# Patient Record
Sex: Male | Born: 1975 | Race: White | Hispanic: No | Marital: Single | Smoking: Never smoker
Health system: Southern US, Community
[De-identification: ages and names within clinical notes are randomized; demographics above are authoritative.]

---

## 2012-06-03 ENCOUNTER — Encounter (HOSPITAL_COMMUNITY): Payer: Self-pay | Admitting: Emergency Medicine

## 2012-06-03 ENCOUNTER — Emergency Department (HOSPITAL_COMMUNITY)
Admission: EM | Admit: 2012-06-03 | Discharge: 2012-06-03 | Disposition: A | Discharge status: Home / Self Care | Payer: Self-pay | Source: Home / Self Care | Attending: Emergency Medicine | Admitting: Emergency Medicine

## 2012-06-03 DIAGNOSIS — K5289 Other specified noninfective gastroenteritis and colitis: Secondary | ICD-10-CM

## 2012-06-03 DIAGNOSIS — K529 Noninfective gastroenteritis and colitis, unspecified: Secondary | ICD-10-CM

## 2012-06-03 DIAGNOSIS — R109 Unspecified abdominal pain: Secondary | ICD-10-CM

## 2012-06-03 MED ORDER — ACIDOPHILUS PROBIOTIC BLEND PO CAPS: 2.0000 | ORAL_CAPSULE | Freq: Two times a day (BID) | ORAL | Status: DC

## 2012-06-03 MED ORDER — DIPHENOXYLATE-ATROPINE 2.5-0.025 MG PO TABS: 1.0000 | ORAL_TABLET | Freq: Four times a day (QID) | ORAL | Status: AC | PRN

## 2012-06-03 NOTE — ED Notes (Signed)
C/o abdominal pain.  Onset 7/10.  Initially had bloating, then cramps and diarrhea.  Diarrhea has decreased .  Reports 2-3 diarrhea stools today.  Patient reports intermittent, similar episodes since thanksgiving.  Patient relates symptoms to eating, admits to eating frequently at fast food restaurants

## 2012-06-03 NOTE — ED Provider Notes (Signed)
History     CSN: [id number]  Arrival date & time 06/03/12  1437   First MD Initiated Contact with Patient 06/03/12 1437      Chief Complaint  Patient presents with  . Abdominal Pain    (Consider location/radiation/quality/duration/timing/severity/associated sxs/prior treatment) HPI Comments: Patient states he ate at [employer] several days ago, reports acute onset of midline abdominal cramping, which is followed by multiple episodes of watery, nonbloody stools. Abdominal cramping improved after having bowel movement. States that the diarrhea has decreased to 2-3 episodes today, and that his stool is now becoming firmer. States that he has some abdominal "soreness", but that the cramps are improving. Reports anorexia, but has been able to eat some small, bland meals.  this does not seem to exacerbate the cramps or the diarrhea. No abdominal distention, nausea, vomiting, fevers. No urinary urgency, frequency, cloudy or oderous urine, hematuria. No decreased urine output. No penile discharge, testicular pain, testicular swelling. Denies alcohol use. No recent travel, recent antibiotic use, raw or undercooked foods, questionable leftovers, known sick contacts. He does not take any medicines on a regular basis. He is not a diabetic.   ROS as noted in HPI. All other ROS negative.   Patient is a 36 y.o. male presenting with abdominal pain. The history is provided by the patient. No language interpreter was used.  Abdominal Pain The primary symptoms of the illness include abdominal pain. The current episode started more than 2 days ago. The onset of the illness was sudden. The problem has been gradually improving.  Additional symptoms associated with the illness include anorexia. Symptoms associated with the illness do not include chills, diaphoresis, heartburn, constipation, urgency, hematuria or frequency. Significant associated medical issues do not include diabetes, gallstones or diverticulitis.     History reviewed. No pertinent past medical history.  History reviewed. No pertinent past surgical history.  History reviewed. No pertinent family history.  History  Substance Use Topics  . Smoking status: Never Smoker   . Smokeless tobacco: Not on file  . Alcohol Use: No      Review of Systems  Constitutional: Negative for chills and diaphoresis.  Gastrointestinal: Positive for abdominal pain and anorexia. Negative for heartburn and constipation.  Genitourinary: Negative for urgency, frequency and hematuria.    Allergies  Penicillins  Home Medications   Current Outpatient Rx  Name Route Sig Dispense Refill  . DIPHENOXYLATE-ATROPINE 2.5-0.025 MG PO TABS Oral Take 1 tablet by mouth 4 (four) times daily as needed for diarrhea or loose stools. 30 tablet 0  . ACIDOPHILUS PROBIOTIC BLEND PO CAPS Oral Take 2 capsules by mouth 2 (two) times daily. 60 capsule 0    BP 120/82  Pulse 75  Temp 98.3 F (36.8 C) (Oral)  Resp 17  SpO2 97%   Physical Exam  Nursing note and vitals reviewed. Constitutional: He is oriented to person, place, and time. He appears well-developed and well-nourished.  HENT:  Head: Normocephalic and atraumatic.  Eyes: Conjunctivae and EOM are normal.  Neck: Normal range of motion.  Cardiovascular: Normal rate, regular rhythm and normal heart sounds.   Pulmonary/Chest: Effort normal. No respiratory distress.  Abdominal: Soft. Normal appearance and bowel sounds are normal. He exhibits no distension. There is tenderness in the right lower quadrant. There is guarding. There is no rigidity, no CVA tenderness, no tenderness at McBurney's point and negative Murphy's sign. No hernia.         Mild tenderness to deep palpation, see drawing.  Genitourinary: Rectum normal  and prostate normal. Rectal exam shows no external hemorrhoid, no internal hemorrhoid, no mass, no tenderness and anal tone normal. Guaiac negative stool.  Musculoskeletal: Normal range of  motion.  Neurological: He is alert and oriented to person, place, and time. Coordination normal.  Skin: Skin is warm and dry.  Psychiatric: He has a normal mood and affect. His behavior is normal. Judgment and thought content normal.    ED Course  Procedures (including critical care time)  Labs Reviewed - No data to display No results found.   1. Abdominal pain   2. Gastroenteritis    Hemoccult was done, was negative.  MDM  No previous records available   patient's vitals are normal, afebrile. States his pain and diarrhea are improving. Pt abd exam is benign, no peritoneal signs. No evidence of surgical abd. Doubt SBO, mesenteric ischemia, appendicitis, hepatitis, cholecystitis, pancreatitis, or perforated viscus. No evidence to suggest testicular source for abdominal pain. Doubt UTI or GU cause of his abdominal pain.  History most suggestive of food poisoning, which is now getting better. Has some mild right lower and tenderness, but no peritoneal signs.  Will have him return tomorrow for repeat abdominal exam. Will try some Lomotil for any persistent abdominal cramping, probiotics. Had extensive discussion on signs and symptoms that should prompt him to go to the emergency department. He agrees to go to the ED for any of these.  Luiz Blare, MD 06/03/12 2113

## 2016-06-18 ENCOUNTER — Emergency Department (HOSPITAL_COMMUNITY)
Admission: EM | Admit: 2016-06-18 | Discharge: 2016-06-19 | Disposition: A | Discharge status: Home / Self Care | Payer: Self-pay | Attending: Emergency Medicine | Admitting: Emergency Medicine

## 2016-06-18 DIAGNOSIS — IMO0001 Reserved for inherently not codable concepts without codable children: Secondary | ICD-10-CM

## 2016-06-18 DIAGNOSIS — R42 Dizziness and giddiness: Secondary | ICD-10-CM | POA: Insufficient documentation

## 2016-06-18 DIAGNOSIS — Z79891 Long term (current) use of opiate analgesic: Secondary | ICD-10-CM | POA: Insufficient documentation

## 2016-06-18 DIAGNOSIS — I10 Essential (primary) hypertension: Secondary | ICD-10-CM | POA: Insufficient documentation

## 2016-06-18 DIAGNOSIS — R110 Nausea: Secondary | ICD-10-CM | POA: Insufficient documentation

## 2016-06-18 DIAGNOSIS — R030 Elevated blood-pressure reading, without diagnosis of hypertension: Secondary | ICD-10-CM

## 2016-06-18 NOTE — ED Triage Notes (Signed)
Patient advised that has been feeling "spacey" x2 weeks.  Patient states that feels "warm and clammy" off and on. Denies fevers at home.  Patient went to Urgent Care today where they told him that his blood pressure is elevated and was advised to come to the ED.  Patient denies chest pain, denies SOB. Denies any pain at this time.

## 2016-06-19 ENCOUNTER — Emergency Department (HOSPITAL_COMMUNITY): Payer: Self-pay

## 2016-06-19 ENCOUNTER — Encounter (HOSPITAL_COMMUNITY): Payer: Self-pay

## 2016-06-19 LAB — CBC WITH DIFFERENTIAL/PLATELET
BASOS PCT: 0 %
Basophils Absolute: 0 10*3/uL (ref 0.0–0.1)
Eosinophils Absolute: 0 10*3/uL (ref 0.0–0.7)
Eosinophils Relative: 0 %
HEMATOCRIT: 45 % (ref 39.0–52.0)
Hemoglobin: 16.2 g/dL (ref 13.0–17.0)
LYMPHS PCT: 9 %
Lymphs Abs: 1.1 10*3/uL (ref 0.7–4.0)
MCH: 33.3 pg (ref 26.0–34.0)
MCHC: 36 g/dL (ref 30.0–36.0)
MCV: 92.4 fL (ref 78.0–100.0)
MONOS PCT: 4 %
Monocytes Absolute: 0.5 10*3/uL (ref 0.1–1.0)
NEUTROS ABS: 10.7 10*3/uL — AB (ref 1.7–7.7)
NEUTROS PCT: 87 %
Platelets: 270 10*3/uL (ref 150–400)
RBC: 4.87 MIL/uL (ref 4.22–5.81)
RDW: 12.1 % (ref 11.5–15.5)
WBC: 12.3 10*3/uL — ABNORMAL HIGH (ref 4.0–10.5)

## 2016-06-19 LAB — I-STAT CHEM 8, ED
BUN: 11 mg/dL (ref 6–20)
CHLORIDE: 103 mmol/L (ref 101–111)
Calcium, Ion: 1.22 mmol/L (ref 1.13–1.30)
Creatinine, Ser: 0.9 mg/dL (ref 0.61–1.24)
Glucose, Bld: 120 mg/dL — ABNORMAL HIGH (ref 65–99)
HEMATOCRIT: 46 % (ref 39.0–52.0)
Hemoglobin: 15.6 g/dL (ref 13.0–17.0)
POTASSIUM: 4 mmol/L (ref 3.5–5.1)
SODIUM: 140 mmol/L (ref 135–145)
TCO2: 24 mmol/L (ref 0–100)

## 2016-06-19 LAB — URINALYSIS, ROUTINE W REFLEX MICROSCOPIC
BILIRUBIN URINE: NEGATIVE
Glucose, UA: NEGATIVE mg/dL
Ketones, ur: NEGATIVE mg/dL
NITRITE: NEGATIVE
PH: 6 (ref 5.0–8.0)
Protein, ur: NEGATIVE mg/dL
SPECIFIC GRAVITY, URINE: 1.016 (ref 1.005–1.030)

## 2016-06-19 LAB — I-STAT TROPONIN, ED: Troponin i, poc: 0 ng/mL (ref 0.00–0.08)

## 2016-06-19 LAB — URINE MICROSCOPIC-ADD ON: SQUAMOUS EPITHELIAL / LPF: NONE SEEN

## 2016-06-19 NOTE — ED Provider Notes (Signed)
[facility]-EMERGENCY DEPT Provider Note   CSN: [id number] Arrival date & time: 06/18/16  2346  First Provider Contact:   First MD Initiated Contact with Patient 06/19/16 0039     By signing my name below, I, Marc White, attest that this documentation has been prepared under the direction and in the presence of Sultan Pargas, MD. Electronically Signed: Rosario White, ED Scribe. 06/19/16. 12:45 AM.  History   Chief Complaint Chief Complaint  Patient presents with  . Hypertension   The history is provided by the patient.   HPI Comments: Marc White. is a 40 y.o. male with a PMHx of HTN, who presents to the Emergency Department complaining of intermittent subjective hypertension episodes x 2 weeks. He reports that he has had intermittent dizzy episodes, chills, and nausea since the onset of his symptoms. Additionally, he reports that he has been hearing a "clicking" in his left ear while swallowing. Pt was seen at [facility] for same today, and was told that his BP was elevated. Pt additionally checked his BP PTA while in a [employer] and saw that it was 178/120 at that time. He also notes  that his diet has been less that healthy lately, and has been consuming a lot of foods with high salt content secondary to him working evenings. Denies a fever, CP, SOB, tinnitus, vomiting or any other pain at this time.   History reviewed. No pertinent past medical history.  There are no active problems to display for this patient.  History reviewed. No pertinent surgical history.  Home Medications    Prior to Admission medications   Medication Sig Start Date End Date Taking? Authorizing Provider  naproxen sodium (ANAPROX) 220 MG tablet Take 440 mg by mouth 2 (two) times daily as needed (for pain.).   Yes Historical Provider, MD    Family History No family history on file.  Social History Social History  Substance Use Topics  . Smoking status: Never Smoker  . Smokeless tobacco: Never  Used  . Alcohol use No     Allergies   Penicillins  Review of Systems Review of Systems  Constitutional: Negative for fever.  HENT: Negative for tinnitus.   Respiratory: Negative for shortness of breath.   Cardiovascular: Negative for chest pain.  Gastrointestinal: Positive for nausea. Negative for vomiting.  Neurological: Positive for dizziness.  All other systems reviewed and are negative.  Physical Exam Updated Vital Signs BP (!) 158/111   Pulse 106   Temp 98.8 F (37.1 C) (Oral)   Resp 20   SpO2 99%   Physical Exam  Constitutional: He is oriented to person, place, and time. He appears well-developed and well-nourished.  HENT:  Head: Normocephalic.  Mouth/Throat: No oropharyngeal exudate.  Left and Right TMs are retracted, but otherwise normal. Post-nasal drip present.   Eyes: Conjunctivae and EOM are normal. Pupils are equal, round, and reactive to light. Right eye exhibits no discharge. Left eye exhibits no discharge. No scleral icterus.  Neck: Normal range of motion. Neck supple. No JVD present. No tracheal deviation present.  Trachea is midline.  Cardiovascular: Normal rate, regular rhythm, normal heart sounds and intact distal pulses.   No murmur heard. Pulmonary/Chest: Effort normal and breath sounds normal. No stridor. No respiratory distress. He has no wheezes. He has no rales.  Lungs CTA bilaterally.  Abdominal: Soft. Bowel sounds are normal. He exhibits no distension. There is no tenderness. There is no rebound and no guarding.  Musculoskeletal: Normal range of  motion. He exhibits no edema.  Lymphadenopathy:    He has no cervical adenopathy.  Neurological: He is alert and oriented to person, place, and time. He has normal reflexes. He displays normal reflexes. No cranial nerve deficit. He exhibits normal muscle tone. Coordination normal.  Cranial nerves 2-12 grossly intact. 5/5 strength in all four extremities.    Skin: Skin is warm. Capillary refill takes  less than 2 seconds.  Psychiatric: He has a normal mood and affect. His behavior is normal.  Nursing note and vitals reviewed.  ED Treatments / Results  DIAGNOSTIC STUDIES: Oxygen Saturation is 99% on RA, normal by my interpretation.   COORDINATION OF CARE: 12:45 AM-Discussed next steps with pt. Pt verbalized understanding and is agreeable with the plan.    LABS Results for orders placed or performed during the hospital encounter of 06/18/16  CBC with Differential/Platelet  Result Value Ref Range   WBC 12.3 (H) 4.0 - 10.5 K/uL   RBC 4.87 4.22 - 5.81 MIL/uL   Hemoglobin 16.2 13.0 - 17.0 g/dL   HCT 23.3 00.7 - 62.2 %   MCV 92.4 78.0 - 100.0 fL   MCH 33.3 26.0 - 34.0 pg   MCHC 36.0 30.0 - 36.0 g/dL   RDW 63.3 35.4 - 56.2 %   Platelets 270 150 - 400 K/uL   Neutrophils Relative % 87 %   Neutro Abs 10.7 (H) 1.7 - 7.7 K/uL   Lymphocytes Relative 9 %   Lymphs Abs 1.1 0.7 - 4.0 K/uL   Monocytes Relative 4 %   Monocytes Absolute 0.5 0.1 - 1.0 K/uL   Eosinophils Relative 0 %   Eosinophils Absolute 0.0 0.0 - 0.7 K/uL   Basophils Relative 0 %   Basophils Absolute 0.0 0.0 - 0.1 K/uL  I-Stat Chem 8, ED  Result Value Ref Range   Sodium 140 135 - 145 mmol/L   Potassium 4.0 3.5 - 5.1 mmol/L   Chloride 103 101 - 111 mmol/L   BUN 11 6 - 20 mg/dL   Creatinine, Ser 5.63 0.61 - 1.24 mg/dL   Glucose, Bld 893 (H) 65 - 99 mg/dL   Calcium, Ion 7.34 2.87 - 1.30 mmol/L   TCO2 24 0 - 100 mmol/L   Hemoglobin 15.6 13.0 - 17.0 g/dL   HCT 68.1 15.7 - 26.2 %  I-stat troponin, ED  Result Value Ref Range   Troponin i, poc 0.00 0.00 - 0.08 ng/mL   Comment 3           IMAGING Dg Chest 2 View  Result Date: 06/19/2016 CLINICAL DATA:  Patient advised that has been feeling "spacey" x2 weeks. Patient states that feels "warm and clammy" off and on. Denies fevers at home. Patient went to Urgent Care today where they told him that his blood pressure is elevated and was advised to come to the ED. Patient  denies chest pain, denies SOB. Denies any pain at this time. Non-smoker. EXAM: CHEST  2 VIEW COMPARISON:  None. FINDINGS: The heart size and mediastinal contours are within normal limits. Both lungs are clear. No pleural effusion or pneumothorax. The visualized skeletal structures are unremarkable. IMPRESSION: Normal chest radiographs. Electronically Signed   By: Amie Portland M.D.   On: 06/19/2016 01:01    EKG Interpretation  Date/Time:  Saturday June 19 2016 01:05:50 EDT Ventricular Rate:  80 PR Interval:    QRS Duration: 87 QT Interval:  339 QTC Calculation: 391 R Axis:   39 Text Interpretation:  Sinus rhythm  Confirmed by [facility]  MD, Noelene Gang ([id number]) on 06/19/2016 1:15:02 AM      Vitals:   06/19/16 0016 06/19/16 0221  BP: (!) 158/111 146/100  Pulse: 106 97  Resp: 20 18  Temp:      Procedures Procedures (including critical care time)  Medications Ordered in ED Medications - No data to display  Initial Impression / Assessment and Plan / ED Course  I have reviewed the triage vital signs and the nursing notes.  Pertinent labs & imaging results that were available during my care of the patient were reviewed by me and considered in my medical decision making (see chart for details).  Clinical Course    Results for orders placed or performed during the hospital encounter of 06/18/16  Urinalysis, Routine w reflex microscopic (not at [facility])  Result Value Ref Range   Color, Urine YELLOW YELLOW   APPearance CLEAR CLEAR   Specific Gravity, Urine 1.016 1.005 - 1.030   pH 6.0 5.0 - 8.0   Glucose, UA NEGATIVE NEGATIVE mg/dL   Hgb urine dipstick SMALL (A) NEGATIVE   Bilirubin Urine NEGATIVE NEGATIVE   Ketones, ur NEGATIVE NEGATIVE mg/dL   Protein, ur NEGATIVE NEGATIVE mg/dL   Nitrite NEGATIVE NEGATIVE   Leukocytes, UA TRACE (A) NEGATIVE  CBC with Differential/Platelet  Result Value Ref Range   WBC 12.3 (H) 4.0 - 10.5 K/uL   RBC 4.87 4.22 - 5.81 MIL/uL   Hemoglobin 16.2  13.0 - 17.0 g/dL   HCT 19.1 47.8 - 29.5 %   MCV 92.4 78.0 - 100.0 fL   MCH 33.3 26.0 - 34.0 pg   MCHC 36.0 30.0 - 36.0 g/dL   RDW 62.1 30.8 - 65.7 %   Platelets 270 150 - 400 K/uL   Neutrophils Relative % 87 %   Neutro Abs 10.7 (H) 1.7 - 7.7 K/uL   Lymphocytes Relative 9 %   Lymphs Abs 1.1 0.7 - 4.0 K/uL   Monocytes Relative 4 %   Monocytes Absolute 0.5 0.1 - 1.0 K/uL   Eosinophils Relative 0 %   Eosinophils Absolute 0.0 0.0 - 0.7 K/uL   Basophils Relative 0 %   Basophils Absolute 0.0 0.0 - 0.1 K/uL  Urine microscopic-add on  Result Value Ref Range   Squamous Epithelial / LPF NONE SEEN NONE SEEN   WBC, UA 0-5 0 - 5 WBC/hpf   RBC / HPF 0-5 0 - 5 RBC/hpf   Bacteria, UA RARE (A) NONE SEEN   Urine-Other MUCOUS PRESENT   I-Stat Chem 8, ED  Result Value Ref Range   Sodium 140 135 - 145 mmol/L   Potassium 4.0 3.5 - 5.1 mmol/L   Chloride 103 101 - 111 mmol/L   BUN 11 6 - 20 mg/dL   Creatinine, Ser 8.46 0.61 - 1.24 mg/dL   Glucose, Bld 962 (H) 65 - 99 mg/dL   Calcium, Ion 9.52 8.41 - 1.30 mmol/L   TCO2 24 0 - 100 mmol/L   Hemoglobin 15.6 13.0 - 17.0 g/dL   HCT 32.4 40.1 - 02.7 %  I-stat troponin, ED  Result Value Ref Range   Troponin i, poc 0.00 0.00 - 0.08 ng/mL   Comment 3           Dg Chest 2 View  Result Date: 06/19/2016 CLINICAL DATA:  Patient advised that has been feeling "spacey" x2 weeks. Patient states that feels "warm and clammy" off and on. Denies fevers at home. Patient went to Urgent Care today where they told  him that his blood pressure is elevated and was advised to come to the ED. Patient denies chest pain, denies SOB. Denies any pain at this time. Non-smoker. EXAM: CHEST  2 VIEW COMPARISON:  None. FINDINGS: The heart size and mediastinal contours are within normal limits. Both lungs are clear. No pleural effusion or pneumothorax. The visualized skeletal structures are unremarkable. IMPRESSION: Normal chest radiographs. Electronically Signed   By: Amie Portland M.D.    On: 06/19/2016 01:01  Ct Head Wo Contrast  Result Date: 06/19/2016 CLINICAL DATA:  elev BP at Urgent Care; advised to come to ED dept.; feels "spacy" x 2 weeks; also feels "warm and clammy"; EXAM: CT HEAD WITHOUT CONTRAST TECHNIQUE: Contiguous axial images were obtained from the base of the skull through the vertex without intravenous contrast. COMPARISON:  None. FINDINGS: The ventricles are normal in size and configuration. There are no parenchymal masses or mass effect, no evidence of an infarct, no extra-axial masses or abnormal fluid collections and no intracranial hemorrhage. The visualized sinuses and mastoid air cells are clear. No skull lesion. IMPRESSION: Normal unenhanced CT scan of the brain. Electronically Signed   By: Amie Portland M.D.   On: 06/19/2016 01:48    Final Clinical Impressions(s) / ED Diagnoses   Final diagnoses:  None    New Prescriptions New Prescriptions   No medications on file    Pt urged to f/u with a PCP for his hypertension concerns.  Will provide DASH diet.  Based on JNC there is no indication for treatment in the ED. Patient is exhibiting signs of peripheral vertigo and I believe this is the source of his symptoms not his BP.  Pt is agreeable with plan. All questions answered to patient's satisfaction. Based on history and exam patient has been appropriately medically screened and emergency conditions excluded. Patient is stable for discharge at this time. Follow up with your PMD for recheck in 2 daysand strict return precautions given.  I personally performed the services described in this documentation, which was scribed in my presence. The recorded information has been reviewed and is accurate.        Cy Blamer, MD 06/19/16 [phone]

## 2016-06-19 NOTE — ED Notes (Signed)
Pt ambulated to XR in no distress and with no assistance

## 2016-06-19 NOTE — ED Notes (Signed)
Pt ambulated to CT scan without assistance or difficulty.

## 2018-03-11 IMAGING — CR DG CHEST 2V
2 series · 2 of 2 positions shown · non-contrast
Comparison: None.

CLINICAL DATA: Patient advised that has been feeling "[place]" x2
weeks. Patient states that feels "warm and clammy" off and on.
Denies fevers at home. Patient went to [HOSPITAL] today where they
told him that his blood pressure is elevated and was advised to come
to the ED. Patient denies chest pain, denies SOB. Denies any pain at
this time. Non-smoker.

EXAM:
CHEST  2 VIEW

[w chest pa]
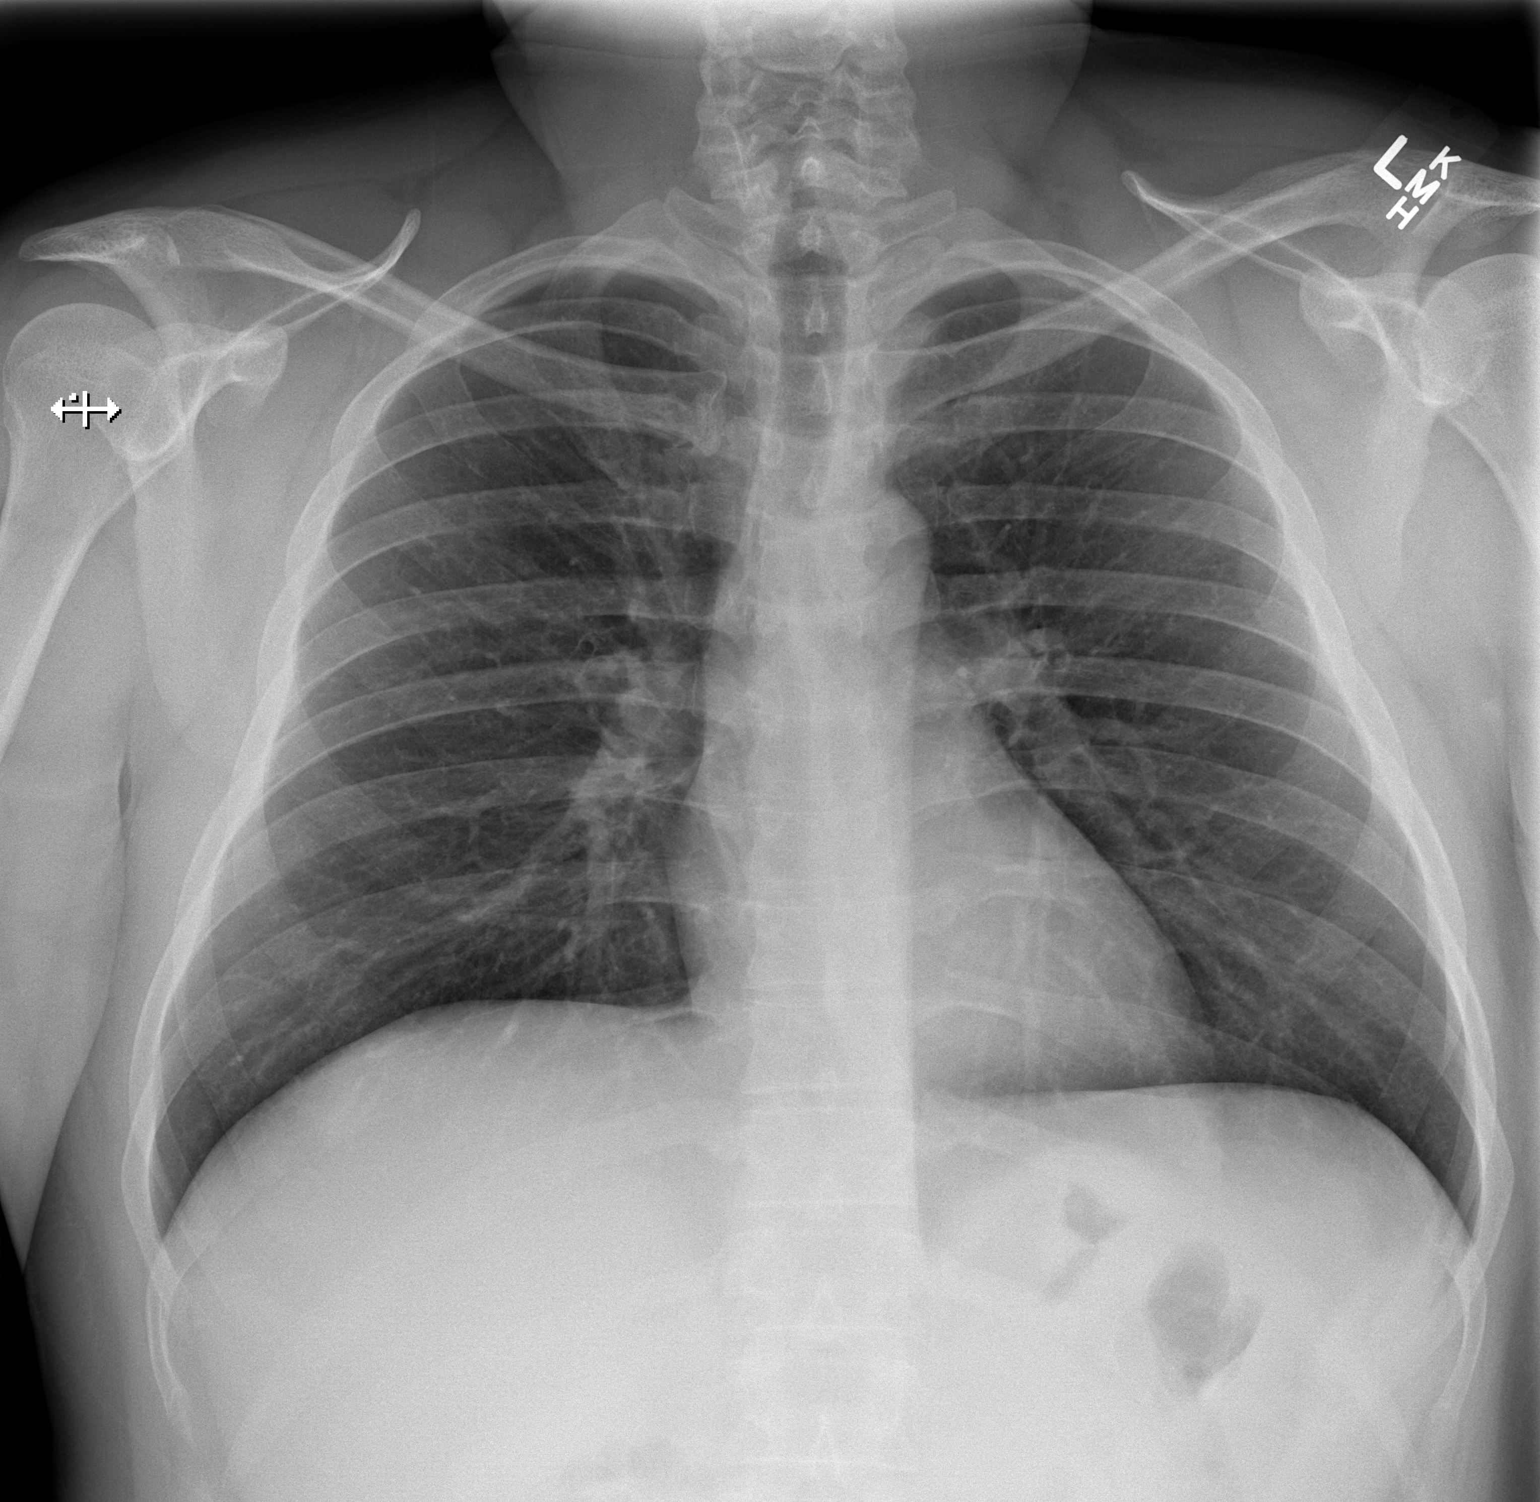

[w chest lat]
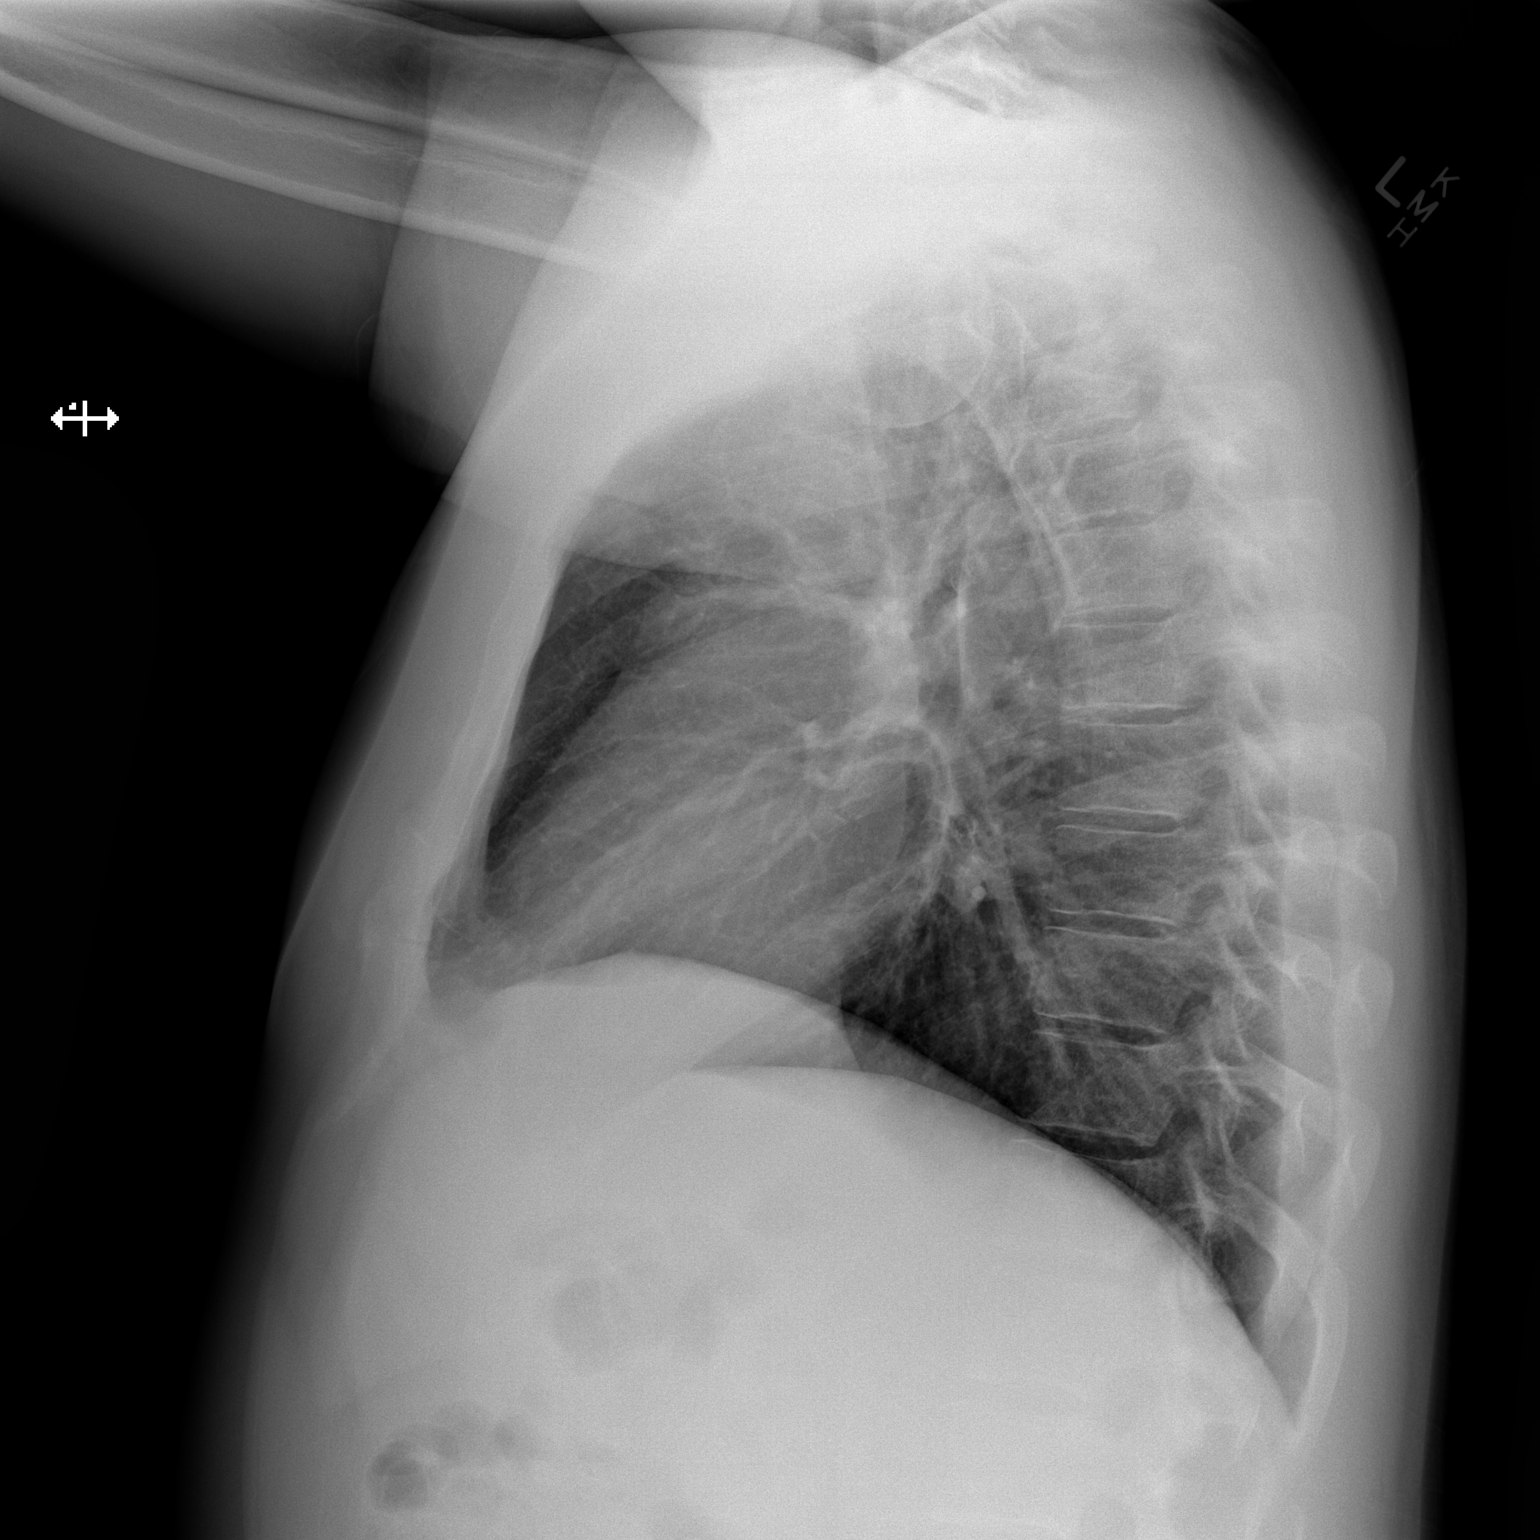

[2 of 2 positions shown; findings below may reference images not displayed]

FINDINGS: The heart size and mediastinal contours are within normal limits.
Both lungs are clear. No pleural effusion or pneumothorax. The
visualized skeletal structures are unremarkable.
IMPRESSION: Normal chest radiographs.

## 2018-03-11 IMAGING — CT CT HEAD W/O CM
3 of 4 series · 15 of 47 positions shown, 18 images · non-contrast
Comparison: None.

CLINICAL DATA: elev BP at [HOSPITAL]; advised to come to ED dept.;
feels "spacy" x 2 weeks; also feels "warm and clammy";

EXAM:
CT HEAD WITHOUT CONTRAST
TECHNIQUE: Contiguous axial images were obtained from the base of the skull
through the vertex without intravenous contrast.

[Series 2: head w/o · axial · non-contrast · 0.45mm/px · z∈[-146,-26]mm · 9 of 30 slices shown, 12 images]
[im 3/30  brain]
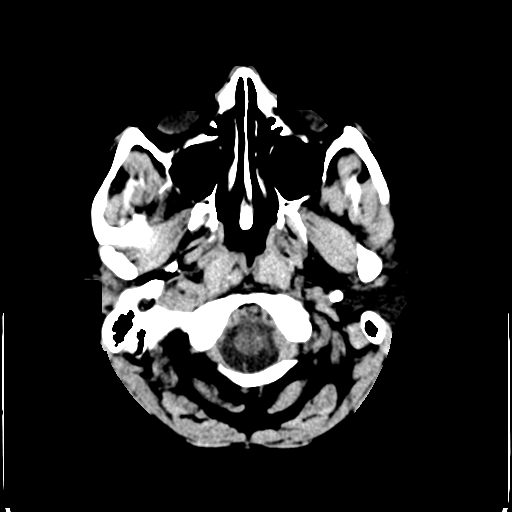
[im 3/30  bone]
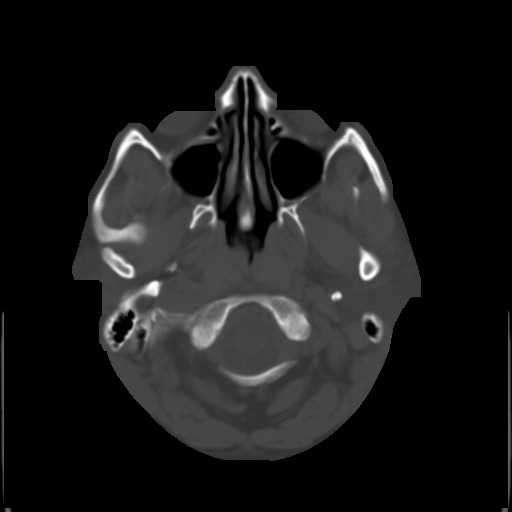
[im 7/30  brain]
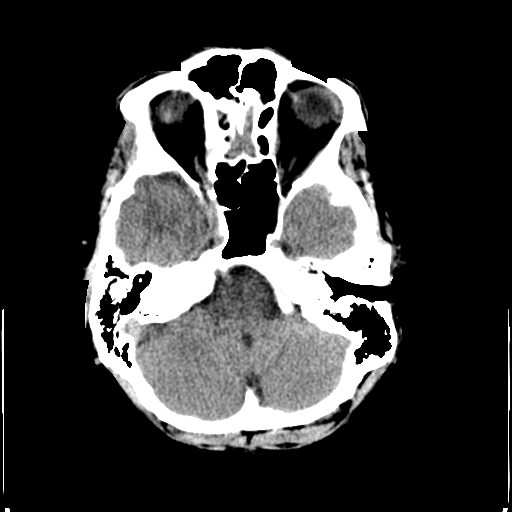
[im 9/30  brain]
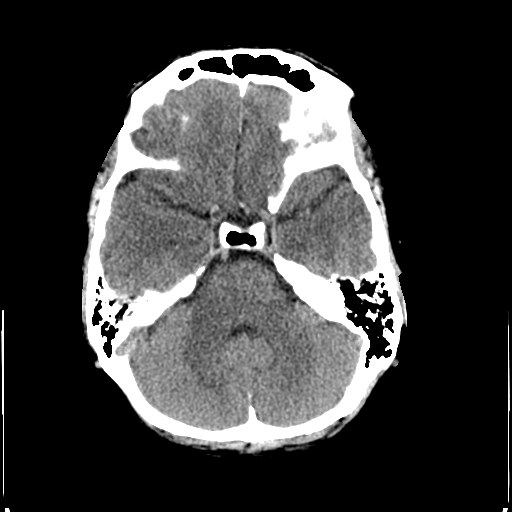
[im 13/30  brain]
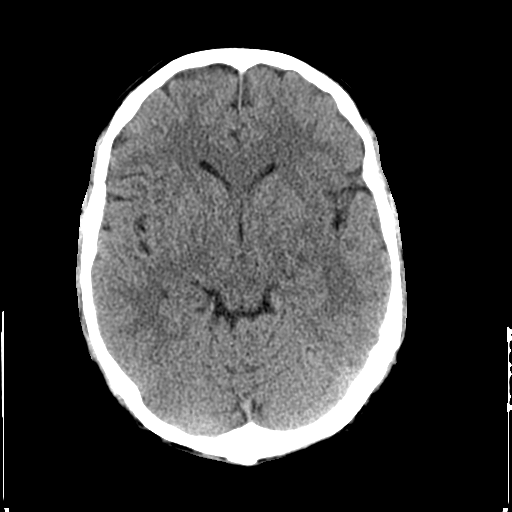
[im 15/30  brain]
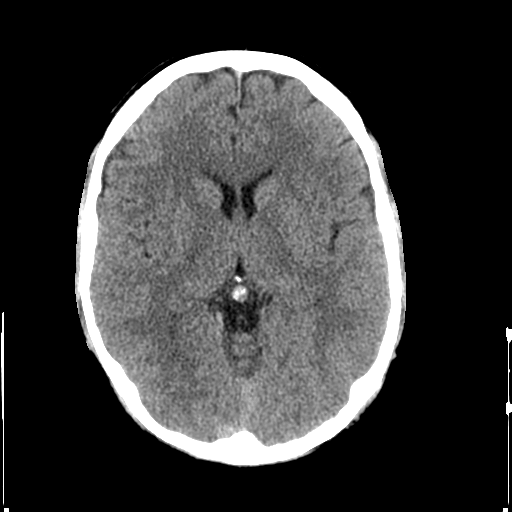
[im 15/30  bone]
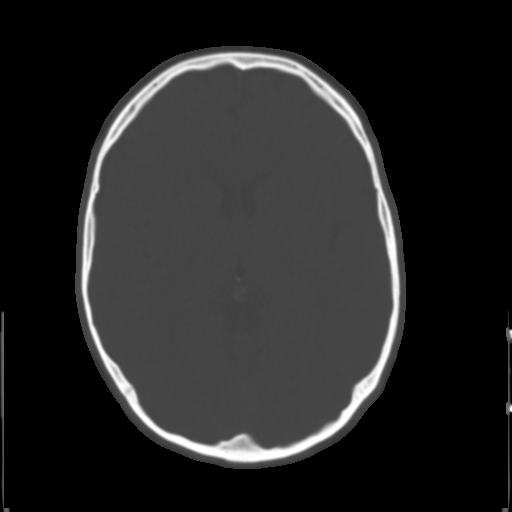
[im 17/30  brain]
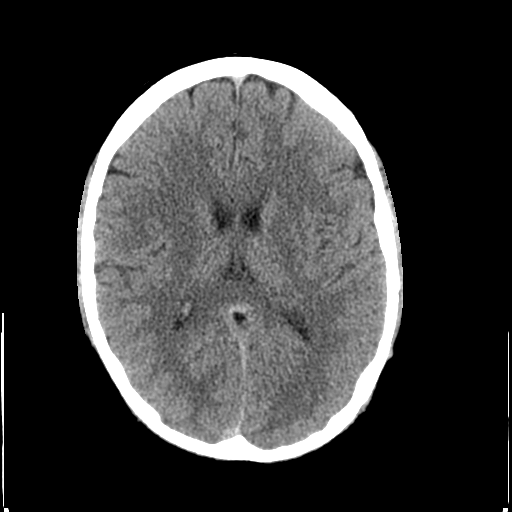
[im 21/30  brain]
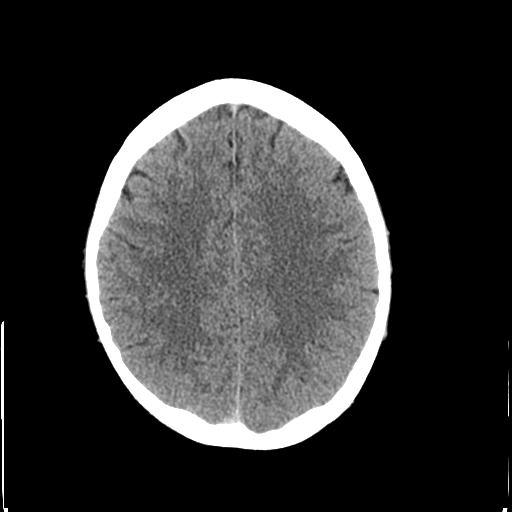
[im 23/30  brain]
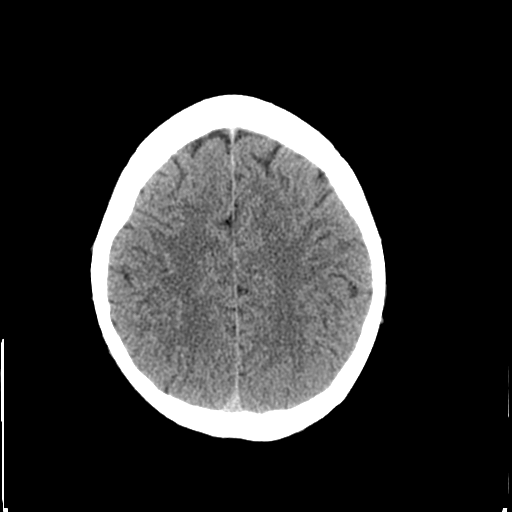
[im 27/30  brain]
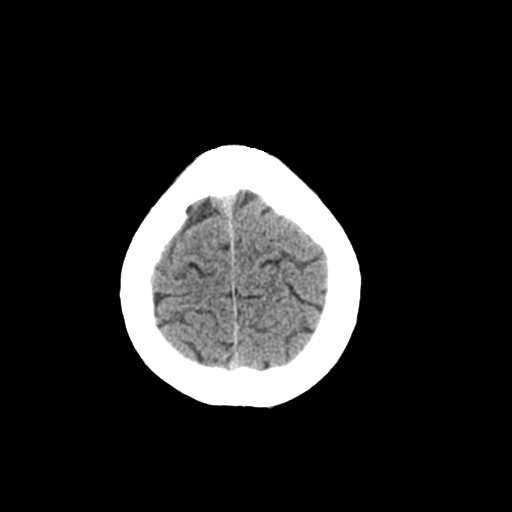
[im 27/30  bone]
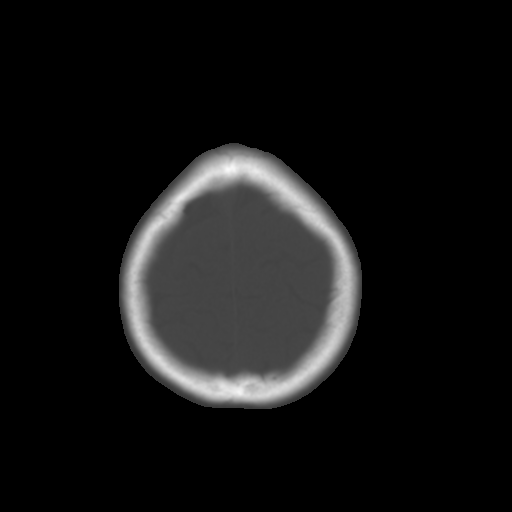

[Series 4: coronal · coronal · 0.34mm/px · 3 of 65 slices shown]
[im 22/65  brain]
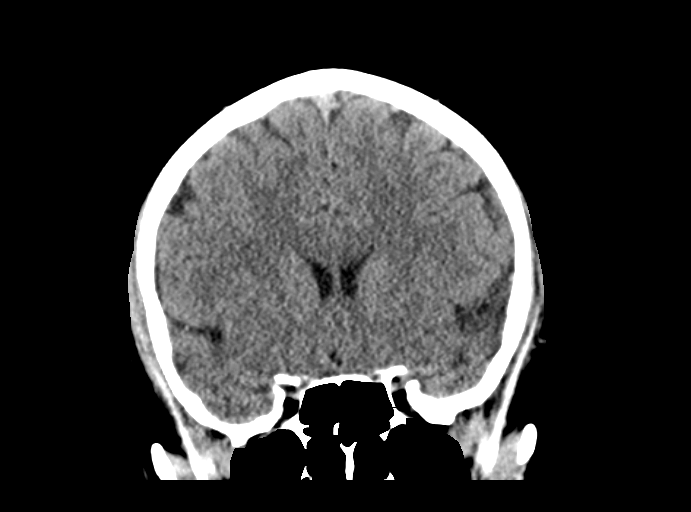
[im 29/65  brain]
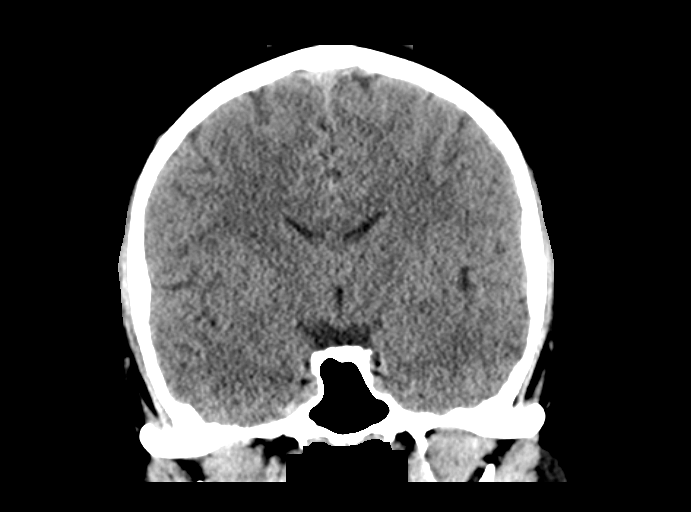
[im 36/65  brain]
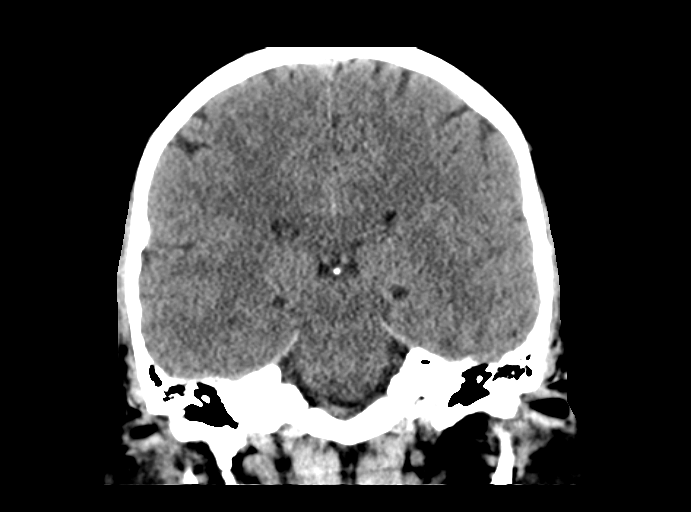

[Series 5: sagittal · sagittal · 0.33mm/px · 3 of 56 slices shown]
[im 19/56  brain]
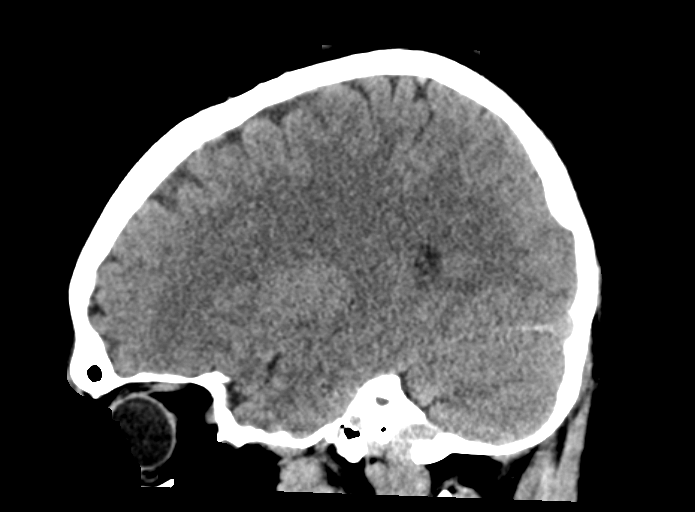
[im 28/56  brain]
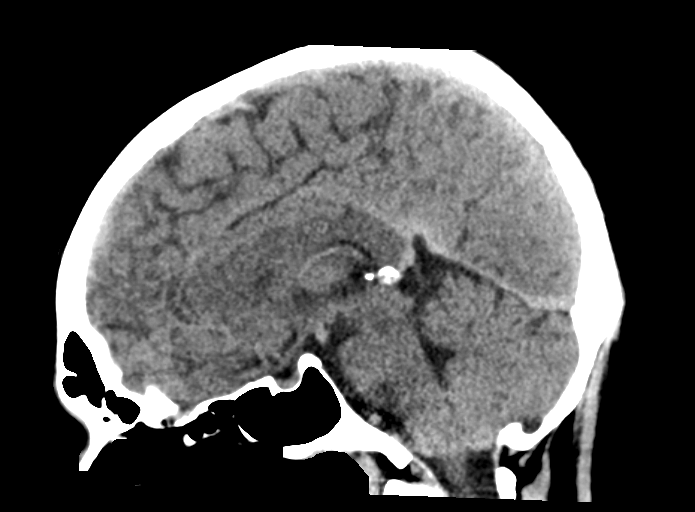
[im 37/56  brain]
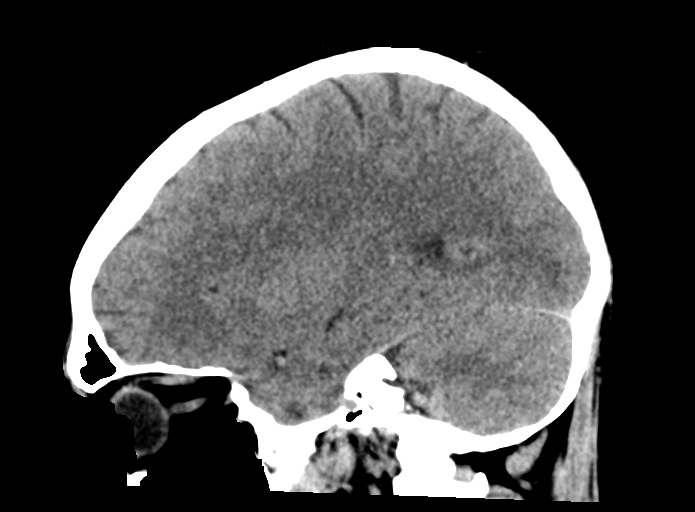

[15 of 47 positions shown; findings below may reference images not displayed]

FINDINGS: The ventricles are normal in size and configuration.

There are no parenchymal masses or mass effect, no evidence of an
infarct, no extra-axial masses or abnormal fluid collections and no
intracranial hemorrhage.

The visualized sinuses and mastoid air cells are clear. No skull
lesion.
IMPRESSION: Normal unenhanced CT scan of the brain.
